# Patient Record
Sex: Female | Born: 1980 | Hispanic: No | Marital: Single | Smoking: Never smoker
Health system: Southern US, Community
[De-identification: ages and names within clinical notes are randomized; demographics above are authoritative.]

## PROBLEM LIST (undated history)

## (undated) DIAGNOSIS — F840 Autistic disorder: Secondary | ICD-10-CM

## (undated) HISTORY — DX: Autistic disorder: F84.0

---

## 2001-09-26 ENCOUNTER — Encounter: Payer: Self-pay | Admitting: Internal Medicine

## 2001-09-26 ENCOUNTER — Encounter: Admission: RE | Admit: 2001-09-26 | Discharge: 2001-09-26 | Payer: Self-pay | Admitting: Internal Medicine

## 2010-10-14 ENCOUNTER — Emergency Department (HOSPITAL_COMMUNITY): Admission: EM | Admit: 2010-10-14 | Discharge: 2010-10-14 | Payer: Self-pay | Admitting: Emergency Medicine

## 2012-07-18 ENCOUNTER — Emergency Department (HOSPITAL_COMMUNITY)
Admission: EM | Admit: 2012-07-18 | Discharge: 2012-07-18 | Disposition: A | Discharge status: Home / Self Care | Payer: No Typology Code available for payment source | Attending: Emergency Medicine | Admitting: Emergency Medicine

## 2012-07-18 ENCOUNTER — Encounter (HOSPITAL_COMMUNITY): Payer: Self-pay | Admitting: *Deleted

## 2012-07-18 DIAGNOSIS — Z043 Encounter for examination and observation following other accident: Secondary | ICD-10-CM | POA: Insufficient documentation
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
## 2012-07-19 NOTE — ED Provider Notes (Signed)
Medical screening examination/treatment/procedure(s) were performed by non-physician practitioner and as supervising physician I was immediately available for consultation/collaboration.  Christophr Calix, MD 07/19/12 0011 

## 2012-09-09 ENCOUNTER — Ambulatory Visit: Payer: Medicaid Other | Admitting: Physical Therapy

## 2012-09-22 ENCOUNTER — Ambulatory Visit: Payer: Medicaid Other | Attending: Orthopaedic Surgery | Admitting: Physical Therapy

## 2012-09-22 DIAGNOSIS — M25579 Pain in unspecified ankle and joints of unspecified foot: Secondary | ICD-10-CM | POA: Insufficient documentation

## 2012-09-22 DIAGNOSIS — IMO0001 Reserved for inherently not codable concepts without codable children: Secondary | ICD-10-CM | POA: Insufficient documentation

## 2012-09-22 DIAGNOSIS — R293 Abnormal posture: Secondary | ICD-10-CM | POA: Insufficient documentation

## 2012-09-30 ENCOUNTER — Ambulatory Visit: Payer: Medicaid Other | Admitting: Physical Therapy

## 2012-10-07 ENCOUNTER — Ambulatory Visit: Payer: Medicaid Other | Admitting: Rehabilitation

## 2012-10-09 ENCOUNTER — Ambulatory Visit: Payer: Medicaid Other | Admitting: Rehabilitation

## 2012-10-20 ENCOUNTER — Ambulatory Visit: Payer: No Typology Code available for payment source | Attending: Orthopaedic Surgery | Admitting: Rehabilitation

## 2012-10-20 DIAGNOSIS — R293 Abnormal posture: Secondary | ICD-10-CM | POA: Insufficient documentation

## 2012-10-20 DIAGNOSIS — M25579 Pain in unspecified ankle and joints of unspecified foot: Secondary | ICD-10-CM | POA: Insufficient documentation

## 2012-10-20 DIAGNOSIS — IMO0001 Reserved for inherently not codable concepts without codable children: Secondary | ICD-10-CM | POA: Insufficient documentation

## 2012-10-29 ENCOUNTER — Other Ambulatory Visit: Payer: Self-pay | Admitting: Internal Medicine

## 2012-10-29 DIAGNOSIS — R109 Unspecified abdominal pain: Secondary | ICD-10-CM

## 2012-11-03 ENCOUNTER — Ambulatory Visit
Admission: RE | Admit: 2012-11-03 | Discharge: 2012-11-03 | Disposition: A | Discharge status: Home / Self Care | Payer: Medicaid Other | Source: Ambulatory Visit | Attending: Internal Medicine | Admitting: Internal Medicine

## 2012-11-03 DIAGNOSIS — R109 Unspecified abdominal pain: Secondary | ICD-10-CM

## 2012-11-03 MED ORDER — IOHEXOL 300 MG/ML  SOLN
100.0000 mL | Freq: Once | INTRAMUSCULAR | Status: AC | PRN
  Administered 2012-11-03: 100 mL via INTRAVENOUS

## 2013-01-16 ENCOUNTER — Ambulatory Visit
Admission: RE | Admit: 2013-01-16 | Discharge: 2013-01-16 | Disposition: A | Discharge status: Home / Self Care | Payer: Medicaid Other | Source: Ambulatory Visit | Attending: Internal Medicine | Admitting: Internal Medicine

## 2013-01-16 ENCOUNTER — Other Ambulatory Visit: Payer: Self-pay | Admitting: Family

## 2013-01-16 DIAGNOSIS — R52 Pain, unspecified: Secondary | ICD-10-CM

## 2013-04-13 ENCOUNTER — Encounter: Payer: Self-pay | Admitting: Obstetrics & Gynecology

## 2013-04-20 ENCOUNTER — Other Ambulatory Visit: Payer: Self-pay | Admitting: *Deleted

## 2013-04-21 ENCOUNTER — Other Ambulatory Visit: Payer: Self-pay

## 2013-04-21 ENCOUNTER — Other Ambulatory Visit: Payer: Self-pay | Admitting: *Deleted

## 2013-04-21 DIAGNOSIS — D259 Leiomyoma of uterus, unspecified: Secondary | ICD-10-CM

## 2013-04-23 ENCOUNTER — Encounter: Payer: Self-pay | Admitting: Obstetrics & Gynecology

## 2013-04-27 ENCOUNTER — Ambulatory Visit: Payer: Self-pay | Admitting: Obstetrics & Gynecology

## 2013-05-06 ENCOUNTER — Encounter: Payer: Self-pay | Admitting: Obstetrics & Gynecology

## 2013-05-06 ENCOUNTER — Ambulatory Visit (INDEPENDENT_AMBULATORY_CARE_PROVIDER_SITE_OTHER): Payer: Medicaid Other | Admitting: Obstetrics & Gynecology

## 2013-05-06 VITALS — BP 104/74 | HR 102 | Temp 98.4°F | Ht 63.0 in | Wt 210.4 lb

## 2013-05-06 DIAGNOSIS — N83209 Unspecified ovarian cyst, unspecified side: Secondary | ICD-10-CM

## 2013-05-06 NOTE — Patient Instructions (Addendum)
Ovarian Cyst  The ovaries are small organs that are on each side of the uterus. The ovaries are the organs that produce the female hormones, estrogen and progesterone. An ovarian cyst is a sac filled with fluid that can vary in its size. It is normal for a small cyst to form in women who are in the childbearing age and who have menstrual periods. This type of cyst is called a follicle cyst that becomes an ovulation cyst (corpus luteum cyst) after it produces the women's egg. It later goes away on its own if the woman does not become pregnant. There are other kinds of ovarian cysts that may cause problems and may need to be treated. The most serious problem is a cyst with cancer. It should be noted that menopausal women who have an ovarian cyst are at a higher risk of it being a cancer cyst. They should be evaluated very quickly, thoroughly and followed closely. This is especially true in menopausal women because of the high rate of ovarian cancer in women in menopause.  CAUSES AND TYPES OF OVARIAN CYSTS:   FUNCTIONAL CYST: The follicle/corpus luteum cyst is a functional cyst that occurs every month during ovulation with the menstrual cycle. They go away with the next menstrual cycle if the woman does not get pregnant. Usually, there are no symptoms with a functional cyst.   ENDOMETRIOMA CYST: This cyst develops from the lining of the uterus tissue. This cyst gets in or on the ovary. It grows every month from the bleeding during the menstrual period. It is also called a "chocolate cyst" because it becomes filled with blood that turns brown. This cyst can cause pain in the lower abdomen during intercourse and with your menstrual period.   CYSTADENOMA CYST: This cyst develops from the cells on the outside of the ovary. They usually are not cancerous. They can get very big and cause lower abdomen pain and pain with intercourse. This type of cyst can twist on itself, cut off its blood supply and cause severe pain. It  also can easily rupture and cause a lot of pain.   DERMOID CYST: This type of cyst is sometimes found in both ovaries. They are found to have different kinds of body tissue in the cyst. The tissue includes skin, teeth, hair, and/or cartilage. They usually do not have symptoms unless they get very big. Dermoid cysts are rarely cancerous.   POLYCYSTIC OVARY: This is a rare condition with hormone problems that produces many small cysts on both ovaries. The cysts are follicle-like cysts that never produce an egg and become a corpus luteum. It can cause an increase in body weight, infertility, acne, increase in body and facial hair and lack of menstrual periods or rare menstrual periods. Many women with this problem develop type 2 diabetes. The exact cause of this problem is unknown. A polycystic ovary is rarely cancerous.   THECA LUTEIN CYST: Occurs when too much hormone (human chorionic gonadotropin) is produced and over-stimulates the ovaries to produce an egg. They are frequently seen when doctors stimulate the ovaries for invitro-fertilization (test tube babies).   LUTEOMA CYST: This cyst is seen during pregnancy. Rarely it can cause an obstruction to the birth canal during labor and delivery. They usually go away after delivery.  SYMPTOMS    Pelvic pain or pressure.   Pain during sexual intercourse.   Increasing girth (swelling) of the abdomen.   Abnormal menstrual periods.   Increasing pain with menstrual periods.     You stop having menstrual periods and you are not pregnant.  DIAGNOSIS   The diagnosis can be made during:   Routine or annual pelvic examination (common).   Ultrasound.   X-ray of the pelvis.   CT Scan.   MRI.   Blood tests.  TREATMENT    Treatment may only be to follow the cyst monthly for 2 to 3 months with your caregiver. Many go away on their own, especially functional cysts.   May be aspirated (drained) with a long needle with ultrasound, or by laparoscopy (inserting a tube into  the pelvis through a small incision).   The whole cyst can be removed by laparoscopy.   Sometimes the cyst may need to be removed through an incision in the lower abdomen.   Hormone treatment is sometimes used to help dissolve certain cysts.   Birth control pills are sometimes used to help dissolve certain cysts.  HOME CARE INSTRUCTIONS   Follow your caregiver's advice regarding:   Medicine.   Follow up visits to evaluate and treat the cyst.   You may need to come back or make an appointment with another caregiver, to find the exact cause of your cyst, if your caregiver is not a gynecologist.   Get your yearly and recommended pelvic examinations and Pap tests.   Let your caregiver know if you have had an ovarian cyst in the past.  SEEK MEDICAL CARE IF:    Your periods are late, irregular, they stop, or are painful.   Your stomach (abdomen) or pelvic pain does not go away.   Your stomach becomes larger or swollen.   You have pressure on your bladder or trouble emptying your bladder completely.   You have painful sexual intercourse.   You have feelings of fullness, pressure, or discomfort in your stomach.   You lose weight for no apparent reason.   You feel generally ill.   You become constipated.   You lose your appetite.   You develop acne.   You have an increase in body and facial hair.   You are gaining weight, without changing your exercise and eating habits.   You think you are pregnant.  SEEK IMMEDIATE MEDICAL CARE IF:    You have increasing abdominal pain.   You feel sick to your stomach (nausea) and/or vomit.   You develop a fever that comes on suddenly.   You develop abdominal pain during a bowel movement.   Your menstrual periods become heavier than usual.  Document Released: 11/05/2005 Document Revised: 01/28/2012 Document Reviewed: 09/08/2009  [employer] Patient Information 2014 [employer]

## 2013-05-06 NOTE — Progress Notes (Signed)
.   Subjective:     Charlotte White is a 32 y.o. female here for a routine exam.  Current complaints - She had a Lumbar-Spine MRI that shows a left ovarian hemorraghic cyst.  Personal health questionnaire reviewed: yes.   Gynecologic History Patient's last menstrual period was 04/21/2013. Contraception: none Last Pap: N/A  Last mammogram: N/A  Obstetric History OB History   Grav Para Term Preterm Abortions TAB SAB Ect Mult Living                   The following portions of the patient's history were reviewed and updated as appropriate: allergies, current medications, past family history, past medical history, past social history, past surgical history and problem list.  Review of Systems Pertinent items are noted in HPI.    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

## 2013-05-07 ENCOUNTER — Ambulatory Visit (HOSPITAL_COMMUNITY)
Admission: RE | Admit: 2013-05-07 | Discharge: 2013-05-07 | Disposition: A | Discharge status: Home / Self Care | Payer: Medicaid Other | Source: Ambulatory Visit | Attending: Obstetrics & Gynecology | Admitting: Obstetrics & Gynecology

## 2013-05-07 DIAGNOSIS — N946 Dysmenorrhea, unspecified: Secondary | ICD-10-CM | POA: Insufficient documentation

## 2013-05-07 DIAGNOSIS — D259 Leiomyoma of uterus, unspecified: Secondary | ICD-10-CM

## 2013-05-08 DIAGNOSIS — N83209 Unspecified ovarian cyst, unspecified side: Secondary | ICD-10-CM | POA: Insufficient documentation

## 2013-05-11 ENCOUNTER — Ambulatory Visit (INDEPENDENT_AMBULATORY_CARE_PROVIDER_SITE_OTHER): Payer: Medicaid Other | Admitting: Obstetrics & Gynecology

## 2013-05-11 ENCOUNTER — Encounter: Payer: Self-pay | Admitting: Obstetrics & Gynecology

## 2013-05-11 VITALS — BP 114/76 | HR 90 | Temp 98.3°F | Wt 209.0 lb

## 2013-05-11 DIAGNOSIS — Z0000 Encounter for general adult medical examination without abnormal findings: Secondary | ICD-10-CM

## 2013-05-11 NOTE — Progress Notes (Signed)
.   Subjective:     Charlotte White is a 32 y.o. female here for ultrasound results - done 05/07/13..  No current complaints.  Personal health questionnaire reviewed: no.   Gynecologic History Patient's last menstrual period was 04/21/2013. Contraception: none Last Pap: N/A Last mammogram: N/A  Obstetric History OB History   Grav Para Term Preterm Abortions TAB SAB Ect Mult Living                   The following portions of the patient's history were reviewed and updated as appropriate: allergies, current medications, past family history, past medical history, past social history, past surgical history and problem list.  Review of Systems Pertinent items are noted in HPI.    Objective:    No exam performed today, patient refused exam.    Assessment:    Healthy female exam.    Plan:    Education reviewed: none. Return prn

## 2013-05-11 NOTE — Patient Instructions (Addendum)

## 2013-05-13 LAB — PAP IG AND HPV HIGH-RISK: HPV DNA High Risk: NOT DETECTED

## 2013-05-27 ENCOUNTER — Ambulatory Visit: Payer: Medicaid Other | Admitting: Obstetrics & Gynecology

## 2013-07-27 ENCOUNTER — Ambulatory Visit: Payer: No Typology Code available for payment source

## 2013-07-27 ENCOUNTER — Ambulatory Visit: Payer: No Typology Code available for payment source | Admitting: Physical Therapy

## 2013-08-03 ENCOUNTER — Ambulatory Visit
Admission: RE | Admit: 2013-08-03 | Discharge: 2013-08-03 | Disposition: A | Discharge status: Home / Self Care | Payer: Medicaid Other | Source: Ambulatory Visit | Attending: Nurse Practitioner | Admitting: Nurse Practitioner

## 2013-08-03 ENCOUNTER — Other Ambulatory Visit: Payer: Self-pay | Admitting: Nurse Practitioner

## 2013-08-03 DIAGNOSIS — R109 Unspecified abdominal pain: Secondary | ICD-10-CM

## 2013-09-21 ENCOUNTER — Ambulatory Visit: Payer: Medicaid Other | Attending: Orthopedic Surgery | Admitting: Physical Therapy

## 2013-09-21 DIAGNOSIS — IMO0001 Reserved for inherently not codable concepts without codable children: Secondary | ICD-10-CM | POA: Insufficient documentation

## 2013-09-21 DIAGNOSIS — M545 Low back pain, unspecified: Secondary | ICD-10-CM | POA: Insufficient documentation

## 2013-09-21 DIAGNOSIS — M25559 Pain in unspecified hip: Secondary | ICD-10-CM | POA: Insufficient documentation

## 2013-09-21 DIAGNOSIS — R293 Abnormal posture: Secondary | ICD-10-CM | POA: Insufficient documentation

## 2013-09-28 ENCOUNTER — Ambulatory Visit: Payer: Medicaid Other | Admitting: Physical Therapy

## 2013-09-30 ENCOUNTER — Ambulatory Visit: Payer: Medicaid Other | Admitting: Physical Therapy

## 2013-10-05 ENCOUNTER — Encounter: Payer: Medicaid Other | Admitting: Physical Therapy

## 2013-10-07 ENCOUNTER — Ambulatory Visit: Payer: Medicaid Other | Admitting: Physical Therapy

## 2014-01-19 IMAGING — CR DG LUMBAR SPINE COMPLETE 4+V
5 series · 5 of 5 positions shown · non-contrast
Comparison: CT abdomen of 11/03/2012

CLINICAL DATA: Low back and bilateral leg pain, no injury

LUMBAR SPINE - COMPLETE 4+ VIEW

[t l-spine a.p.]
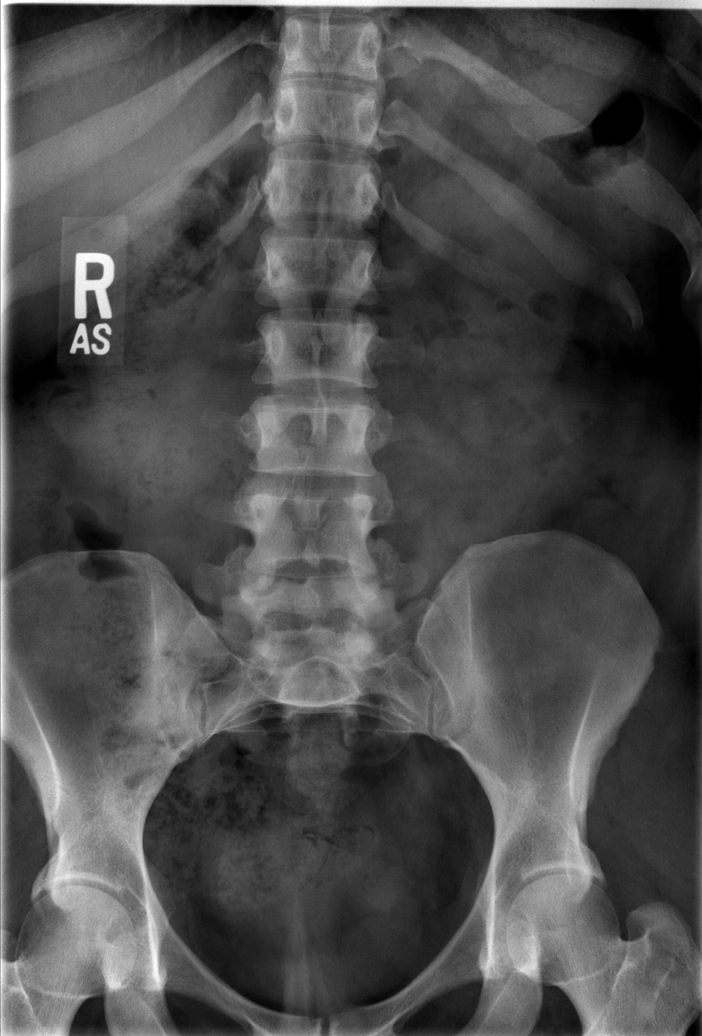

[t l-spine oblique exposure (1 of 2)]
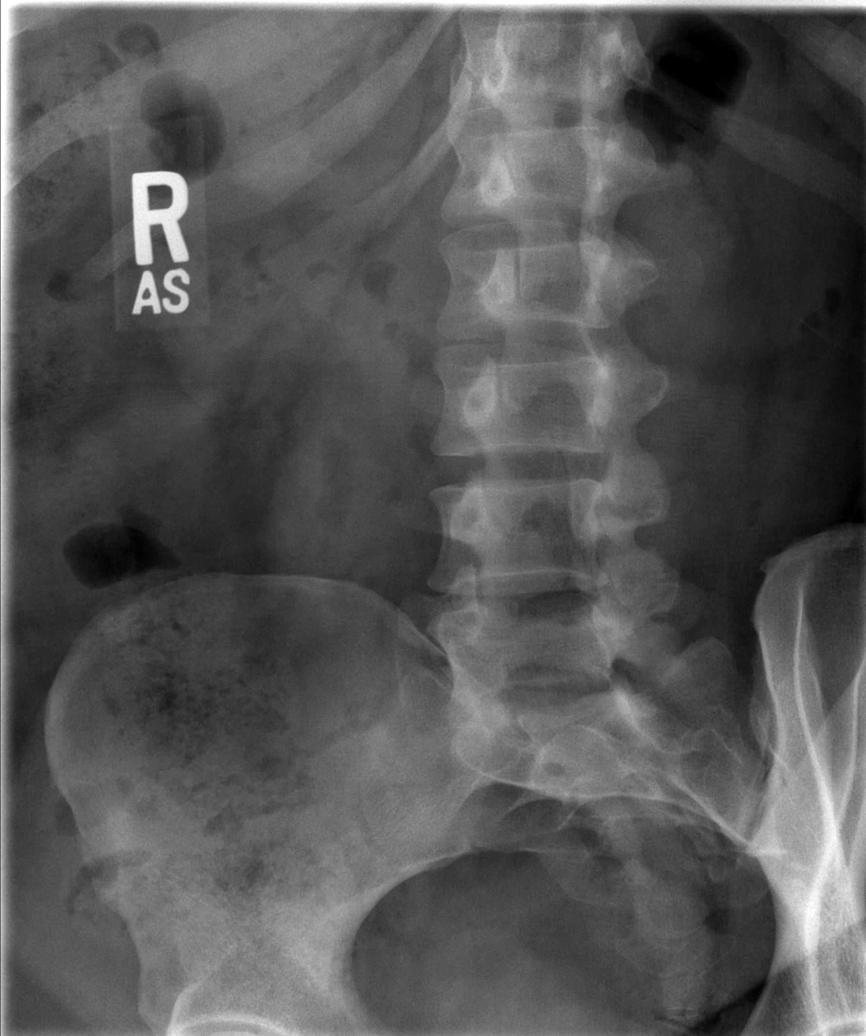

[t l-spine oblique exposure (2 of 2)]
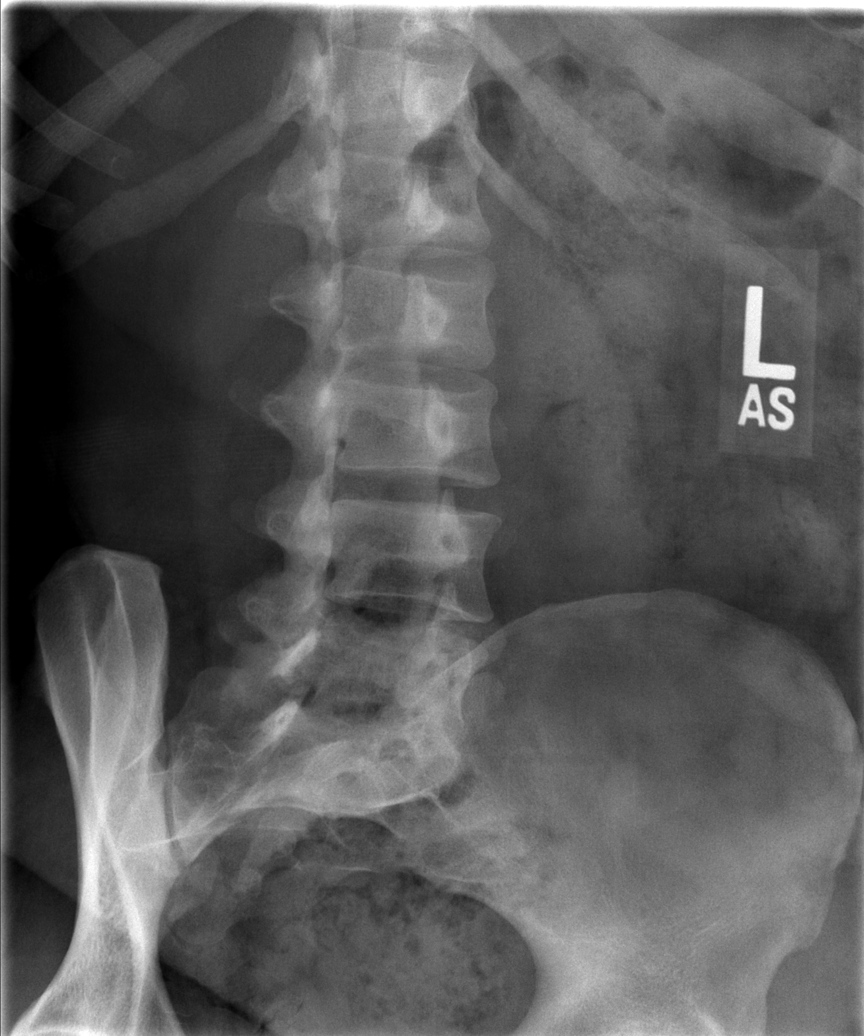

[t l-spine lat]
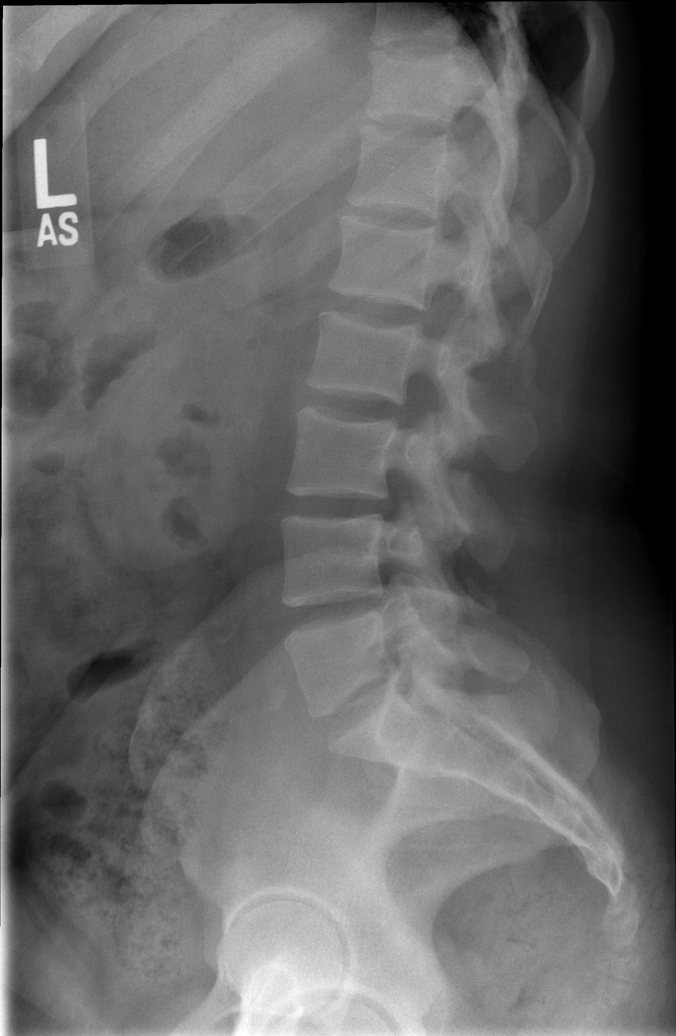

[t l-spine l5-s1 spot]
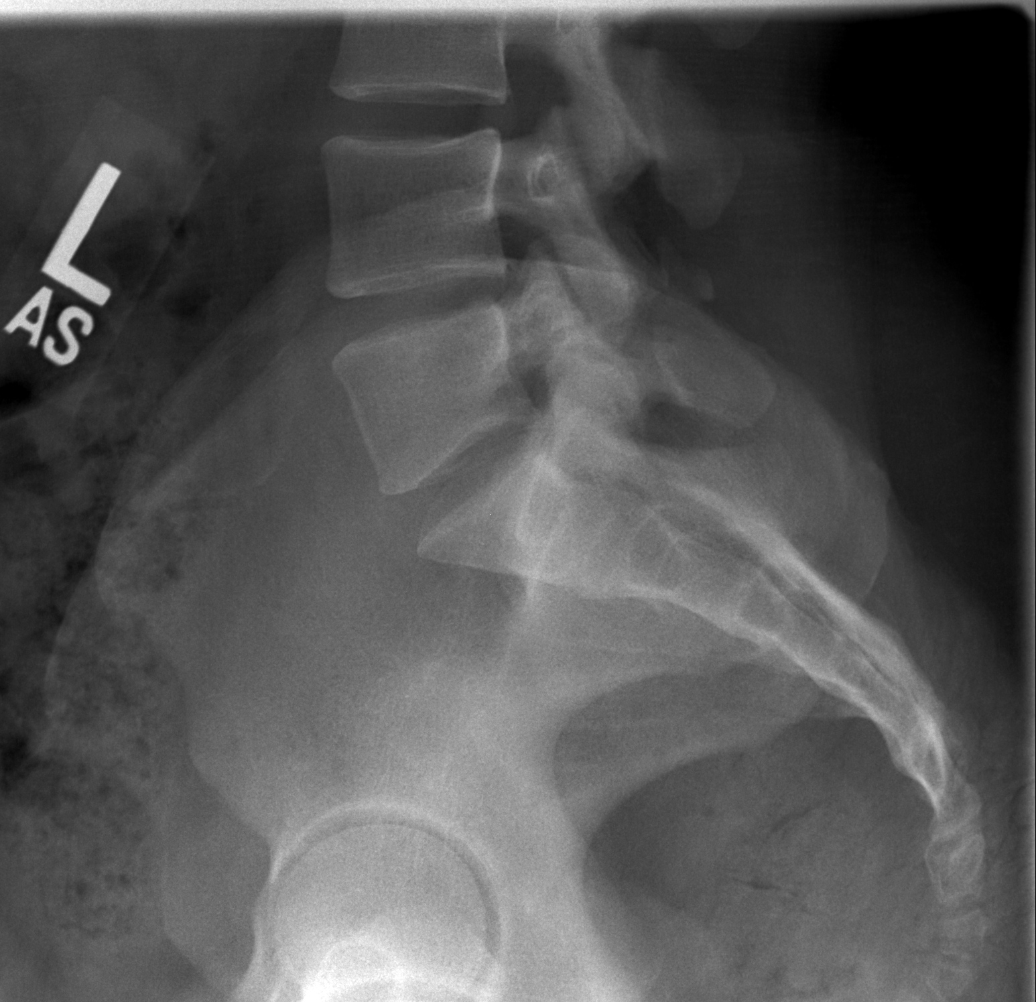

[5 of 5 positions shown; findings below may reference images not displayed]

FINDINGS: The lumbar vertebrae are in normal alignment.
Intervertebral disc spaces appear normal.  No compression deformity
is seen.  No pars defect is noted.  The SI joints appear
corticated.
IMPRESSION: Normal alignment.  Normal intervertebral disc spaces.

## 2015-08-17 DIAGNOSIS — M25559 Pain in unspecified hip: Secondary | ICD-10-CM | POA: Insufficient documentation

## 2017-05-17 ENCOUNTER — Other Ambulatory Visit: Payer: Self-pay | Admitting: Internal Medicine

## 2017-05-17 DIAGNOSIS — M79605 Pain in left leg: Principal | ICD-10-CM

## 2017-05-17 DIAGNOSIS — M79604 Pain in right leg: Secondary | ICD-10-CM

## 2017-05-20 ENCOUNTER — Ambulatory Visit
Admission: RE | Admit: 2017-05-20 | Discharge: 2017-05-20 | Disposition: A | Discharge status: Home / Self Care | Payer: Self-pay | Source: Ambulatory Visit | Attending: Internal Medicine | Admitting: Internal Medicine

## 2017-05-20 DIAGNOSIS — M79604 Pain in right leg: Secondary | ICD-10-CM

## 2017-05-20 DIAGNOSIS — M79605 Pain in left leg: Principal | ICD-10-CM

## 2017-07-25 DIAGNOSIS — E669 Obesity, unspecified: Secondary | ICD-10-CM | POA: Insufficient documentation

## 2017-08-06 DIAGNOSIS — D509 Iron deficiency anemia, unspecified: Secondary | ICD-10-CM | POA: Insufficient documentation

## 2017-08-22 ENCOUNTER — Ambulatory Visit (INDEPENDENT_AMBULATORY_CARE_PROVIDER_SITE_OTHER): Payer: Medicare HMO

## 2017-08-22 ENCOUNTER — Ambulatory Visit (INDEPENDENT_AMBULATORY_CARE_PROVIDER_SITE_OTHER): Payer: Medicare HMO | Admitting: Podiatry

## 2017-08-22 ENCOUNTER — Encounter: Payer: Self-pay | Admitting: Podiatry

## 2017-08-22 VITALS — BP 109/70 | HR 93 | Resp 16

## 2017-08-22 DIAGNOSIS — M722 Plantar fascial fibromatosis: Secondary | ICD-10-CM

## 2017-08-22 MED ORDER — TRIAMCINOLONE ACETONIDE 10 MG/ML IJ SUSP
10.0000 mg | Freq: Once | INTRAMUSCULAR | Status: AC
  Administered 2017-08-22: 10 mg

## 2017-08-22 NOTE — Progress Notes (Signed)
   Subjective:    Patient ID: Charlotte White, female    DOB: 09/28/1981, 36 y.o.   MRN: [id number]  HPI    Review of Systems  All other systems reviewed and are negative.      Objective:   Physical Exam        Assessment & Plan:

## 2017-08-22 NOTE — Progress Notes (Signed)
Subjective:    Patient ID: Charlotte White, female   DOB: 36 y.o.   MRN: [id number]   HPI patient presents with caregiver stating she's having a lot of pain in her right heel. States it's been present for on and off for 2 years worse over the last few months and patient states that she never has smoked    Review of Systems  All other systems reviewed and are negative.       Objective:  Physical Exam  Constitutional: She appears well-developed and well-nourished.  Cardiovascular: Intact distal pulses.   Pulmonary/Chest: Effort normal.  Musculoskeletal: Normal range of motion.  Neurological: She is alert.  Skin: Skin is warm.  Nursing note and vitals reviewed.  neurovascular status found to be intact with muscle strength adequate range of motion within normal limits with no equinus condition noted. Patient's noted to have exquisite discomfort in the right plantar fascia at the insertional point tendon into the calcaneus with inflammation and fluid around the medial band and moderate depression of the arch is noted     Assessment:    Acute plantar fasciitis right with inflammation fluid buildup     Plan:    H&P was found to be reviewed with patient and x-rays reviewed. I injected the plantar fascia right 3 mg Kenalog 5 mill grams Xylocaine and applied fascial brace instructed on supportive shoe physical therapy and patient be seen back to recheck again in the next several weeks  X-rays indicate small spur with no indication of stress fracture arthritis

## 2017-08-22 NOTE — Patient Instructions (Signed)

## 2017-08-26 ENCOUNTER — Ambulatory Visit: Payer: Self-pay | Admitting: Obstetrics

## 2017-08-30 ENCOUNTER — Ambulatory Visit (INDEPENDENT_AMBULATORY_CARE_PROVIDER_SITE_OTHER): Payer: Medicare HMO | Admitting: Podiatry

## 2017-08-30 DIAGNOSIS — M722 Plantar fascial fibromatosis: Secondary | ICD-10-CM

## 2017-08-30 MED ORDER — TRIAMCINOLONE ACETONIDE 10 MG/ML IJ SUSP
10.0000 mg | Freq: Once | INTRAMUSCULAR | Status: AC
  Administered 2017-08-30: 10 mg

## 2017-08-30 NOTE — Progress Notes (Signed)
Subjective:    Patient ID: Charlotte White, female   DOB: 36 y.o.   MRN: [id number]   HPI patient presents with caregiver stating that the heel is hurting again and it's worse when getting up in the morning and after periods of sitting    ROS      Objective:  Physical Exam neurovascular status intact with inflammatory capsulitis of the right plantar heel at the insertional point tendon calcaneus with depression of the arch noted     Assessment:    Continue plantar fasciitis right with inflammation fluid buildup     Plan:    Today I went ahead and I educated them on the importance of stretch and I did dispense a night splint to wear to try to support the plantar fascia at to take stress off her heel I reinjected the plantar fascial 3 Mill grams Kenalog 5 mill grams Xylocaine and patient be seen back to recheck

## 2017-09-04 ENCOUNTER — Other Ambulatory Visit (HOSPITAL_COMMUNITY)
Admission: RE | Admit: 2017-09-04 | Discharge: 2017-09-04 | Disposition: A | Discharge status: Home / Self Care | Payer: Medicare HMO | Source: Ambulatory Visit | Attending: Certified Nurse Midwife | Admitting: Certified Nurse Midwife

## 2017-09-04 ENCOUNTER — Ambulatory Visit (INDEPENDENT_AMBULATORY_CARE_PROVIDER_SITE_OTHER): Payer: Medicare HMO | Admitting: Certified Nurse Midwife

## 2017-09-04 ENCOUNTER — Encounter: Payer: Self-pay | Admitting: Obstetrics

## 2017-09-04 VITALS — BP 110/79 | HR 84 | Ht 64.0 in | Wt 218.3 lb

## 2017-09-04 DIAGNOSIS — N939 Abnormal uterine and vaginal bleeding, unspecified: Secondary | ICD-10-CM | POA: Insufficient documentation

## 2017-09-04 DIAGNOSIS — Z01419 Encounter for gynecological examination (general) (routine) without abnormal findings: Secondary | ICD-10-CM | POA: Insufficient documentation

## 2017-09-04 DIAGNOSIS — N898 Other specified noninflammatory disorders of vagina: Secondary | ICD-10-CM | POA: Insufficient documentation

## 2017-09-04 DIAGNOSIS — F840 Autistic disorder: Secondary | ICD-10-CM | POA: Insufficient documentation

## 2017-09-04 NOTE — Progress Notes (Signed)
Subjective:        Charlotte White is a 36 y.o. female here for a routine exam.  Current complaints: Patient here with her mother.  Patient is autistic.  Patient is able to understand simple commands.  This is her first GYN exam.  Does have a hx of abnormal periods lasting about 7 days with heavy bleeding, cramping and mood changes per her mother.  Mother desires for cycle control and has questions about fibroids and endometriosis.      Personal health questionnaire:  Is patient Charlotte White, have a family history of breast and/or ovarian cancer: no Is there a family history of uterine cancer diagnosed at age < 59, gastrointestinal cancer, urinary tract cancer, family member who is a Field seismologist syndrome-associated carrier: no Is the patient overweight and hypertensive, family history of diabetes, personal history of gestational diabetes, preeclampsia or PCOS: yes Is patient over 63, have PCOS,  family history of premature CHD under age 53, diabetes, smoke, have hypertension or peripheral artery disease:  no At any time, has a partner hit, kicked or otherwise hurt or frightened you?: no Over the past 2 weeks, have you felt down, depressed or hopeless?: no Over the past 2 weeks, have you felt little interest or pleasure in doing things?:no   Gynecologic History Patient's last menstrual period was 08/14/2017. Contraception: abstinence Last Pap: never.  Last mammogram: age <40. Per her mother does have a screening mammogram scheduled later this week.  No family history of BCA.    Obstetric History OB History  No data available    Past Medical History:  Diagnosis Date  . Autism     History reviewed. No pertinent surgical history.   Current Outpatient Prescriptions:  .  Biotin 10000 MCG TABS, Take by mouth., Disp: , Rfl:  .  Cholecalciferol (VITAMIN D3) 5000 UNIT/0.5ML LIQD, Vitamin D3  Take once daily, Disp: , Rfl:  .  ibuprofen (ADVIL,MOTRIN) 200 MG tablet, Take 200 mg by mouth  every 6 (six) hours as needed., Disp: , Rfl:  .  ranitidine (ZANTAC) 150 MG tablet, Take 150 mg by mouth 2 (two) times daily., Disp: , Rfl:  .  tiZANidine (ZANAFLEX) 4 MG tablet, tizanidine 4 mg tablet, Disp: , Rfl:  .  UNABLE TO FIND, Bio Freeze therapy cream, Disp: , Rfl:  .  UNABLE TO FIND, Magnesium Sport Balm, Disp: , Rfl:  .  Esomeprazole Magnesium (NEXIUM PO), Take 1 tablet by mouth daily., Disp: , Rfl:  .  Iron-Vitamin C (IRON 100/C) 100-250 MG TABS, Take 1 tablet by mouth daily. Folivane-Plus, Disp: , Rfl:  No Known Allergies  Social History  Substance Use Topics  . Smoking status: Never Smoker  . Smokeless tobacco: Never Used  . Alcohol use No    Family History  Problem Relation Age of Onset  . Heart disease Mother   . Hyperlipidemia Mother   . Hypertension Mother   . Heart disease Maternal Aunt   . Autism Maternal Aunt   . Heart disease Paternal Grandmother   . Ovarian cysts Sister       Review of Systems  Constitutional: negative for fatigue and weight loss Respiratory: negative for cough and wheezing Cardiovascular: negative for chest pain, fatigue and palpitations Gastrointestinal: negative for abdominal pain and change in bowel habits Musculoskeletal:negative for myalgias Neurological: negative for gait problems and tremors Behavioral/Psych: negative for abusive relationship, depression Endocrine: negative for temperature intolerance    Genitourinary:negative for abnormal menstrual periods, genital lesions, hot  flashes, sexual problems and vaginal discharge Integument/breast: negative for breast lump, breast tenderness, nipple discharge and skin lesion(s)    Objective:       BP 110/79   Pulse 84   Ht 5\' 4"  (1.626 m)   Wt 218 lb 4.8 oz (99 kg)   LMP 08/14/2017   BMI 37.47 kg/m  General:   alert  Skin:   no rash or abnormalities  Lungs:   clear to auscultation bilaterally  Heart:   regular rate and rhythm, S1, S2 normal, no murmur, click, rub or  gallop  Breasts:   normal without suspicious masses, skin or nipple changes or axillary nodes, dense breast tissue  Abdomen:  normal findings: no organomegaly, soft, non-tender and no hernia  Pelvis:  External genitalia: normal general appearance Urinary system: urethral meatus normal and bladder without fullness, nontender Vaginal: normal without tenderness, induration or masses, narrow interoitus Cervix: unable to visualize Adnexa: unable to tolerate Uterus: unable to tolerate   Lab Review Urine pregnancy test Labs reviewed no Radiologic studies reviewed no  50% of 45 min visit spent on counseling and coordination of care.    Assessment & Plan    Healthy female exam.    1. Well woman exam    - Cytology - PAP  2. Abnormal uterine bleeding (AUB)    - US Pelvis Complete; Future - Cervicovaginal ancillary only  3. Autism     4. Vaginal discharge    - Cervicovaginal ancillary only   Education reviewed: calcium supplements, depression evaluation, low fat, low cholesterol diet, safe sex/STD prevention, self breast exams, skin cancer screening and weight bearing exercise. Contraception: abstinence. Follow up in: 1 year.   Meds ordered this encounter  Medications  . Biotin 10000 MCG TABS    Sig: Take by mouth.  . ranitidine (ZANTAC) 150 MG tablet    Sig: Take 150 mg by mouth 2 (two) times daily.   Orders Placed This Encounter  Procedures  . US Pelvis Complete    Autistic, not able to tolerate transvaginal US.    Standing Status:   Future    Standing Expiration Date:   11/04/2018    Order Specific Question:   Reason for Exam (SYMPTOM  OR DIAGNOSIS REQUIRED)    Answer:   AUB    Order Specific Question:   Preferred imaging location?    Answer:   [facility]   Need to obtain previous records Possible management options include: Depo provera injections or LoLo Follow up as needed.

## 2017-09-04 NOTE — Progress Notes (Signed)
Patient is in the office with mother's assistance, states that patient has regular cycle every 21 days with heavy bleeding. Patient's cycle lasts about 7 days.

## 2017-09-05 LAB — CERVICOVAGINAL ANCILLARY ONLY
Bacterial vaginitis: POSITIVE — AB
Candida vaginitis: NEGATIVE

## 2017-09-06 ENCOUNTER — Telehealth: Payer: Self-pay

## 2017-09-06 ENCOUNTER — Other Ambulatory Visit: Payer: Self-pay | Admitting: Certified Nurse Midwife

## 2017-09-06 DIAGNOSIS — B9689 Other specified bacterial agents as the cause of diseases classified elsewhere: Secondary | ICD-10-CM

## 2017-09-06 DIAGNOSIS — N760 Acute vaginitis: Principal | ICD-10-CM

## 2017-09-06 LAB — CYTOLOGY - PAP
Diagnosis: NEGATIVE
HPV (WINDOPATH): NOT DETECTED

## 2017-09-06 MED ORDER — METRONIDAZOLE 500 MG PO TABS: 500.0000 mg | ORAL_TABLET | Freq: Two times a day (BID) | ORAL | 0 refills | Status: DC

## 2017-09-06 NOTE — Telephone Encounter (Signed)
Patient to pick up Rx at pharmacy.

## 2017-09-06 NOTE — Telephone Encounter (Signed)
-----   Message from Morene Crocker, CNM sent at 09/06/2017  9:48 AM EDT ----- Please call patient and notify of +BV results. Metrondiazole 500 mg PO BID x7 days. #14 no refills.  Please tell her not to drink alcohol with this medication as it will cause her to vomit.  Please also ask her if she will need anything for yeast.   Thank you, Kandis Cocking CNM

## 2017-09-11 ENCOUNTER — Ambulatory Visit (HOSPITAL_COMMUNITY): Payer: Medicare HMO

## 2017-09-12 ENCOUNTER — Other Ambulatory Visit: Payer: Self-pay | Admitting: Certified Nurse Midwife

## 2017-09-16 ENCOUNTER — Other Ambulatory Visit: Payer: Self-pay | Admitting: Certified Nurse Midwife

## 2017-09-18 ENCOUNTER — Ambulatory Visit (HOSPITAL_COMMUNITY): Payer: Medicare HMO

## 2017-09-27 ENCOUNTER — Ambulatory Visit (HOSPITAL_COMMUNITY): Payer: Medicare HMO

## 2017-10-21 ENCOUNTER — Ambulatory Visit (HOSPITAL_COMMUNITY)
Admission: RE | Admit: 2017-10-21 | Discharge: 2017-10-21 | Disposition: A | Discharge status: Home / Self Care | Payer: Medicare HMO | Source: Ambulatory Visit | Attending: Certified Nurse Midwife | Admitting: Certified Nurse Midwife

## 2017-10-21 DIAGNOSIS — N939 Abnormal uterine and vaginal bleeding, unspecified: Secondary | ICD-10-CM | POA: Diagnosis not present

## 2017-10-25 ENCOUNTER — Ambulatory Visit: Payer: Medicare HMO | Admitting: Certified Nurse Midwife

## 2018-03-28 ENCOUNTER — Telehealth: Payer: Self-pay

## 2018-03-28 NOTE — Telephone Encounter (Signed)
Pt mom called, to get follow up appt for pt. due to cyst on L ovary, was advised to follow up here.   Scheduler advised me that the pract. admin will handle since pt may require sedation for exams.

## 2018-03-31 ENCOUNTER — Ambulatory Visit (INDEPENDENT_AMBULATORY_CARE_PROVIDER_SITE_OTHER): Payer: Medicare HMO | Admitting: Obstetrics and Gynecology

## 2018-03-31 ENCOUNTER — Encounter: Payer: Self-pay | Admitting: Obstetrics and Gynecology

## 2018-03-31 VITALS — BP 122/80 | HR 77 | Ht 63.0 in | Wt 224.7 lb

## 2018-03-31 DIAGNOSIS — N83202 Unspecified ovarian cyst, left side: Secondary | ICD-10-CM
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
## 2018-03-31 NOTE — Progress Notes (Signed)
37 yo G0 here for follow up on left ovarian cyst. Patient reports onset of left groin pain earlier this month. The pain started after sitting on a bench that was unbalanced and caused her body to jerk. The pain is localized in her left upper thigh. She is able to ambulate and states that the pain is worst with prolonged sitting. She was prescribed Toradol which she plans on starting today. She denies any abdominal/pelvic pain. She reports a monthly cycle of 21 days with bleeding for 4-5 days. She is not sexually active.   Past Medical History:  Diagnosis Date  . Autism    No past surgical history on file. Family History  Problem Relation Age of Onset  . Heart disease Mother   . Hyperlipidemia Mother   . Hypertension Mother   . Heart disease Maternal Aunt   . Autism Maternal Aunt   . Heart disease Paternal Grandmother   . Ovarian cysts Sister    Social History   Tobacco Use  . Smoking status: Never Smoker  . Smokeless tobacco: Never Used  Substance Use Topics  . Alcohol use: No  . Drug use: No   ROS See pertinent in HPI  Blood pressure 122/80, pulse 77, height 5\' 3"  (1.6 m), weight 224 lb 11.2 oz (101.9 kg), last menstrual period 03/12/2018. GENERAL: Well-developed, well-nourished female in no acute distress.  ABDOMEN: Soft, nontender, nondistended. No organomegaly. PELVIC: Needs to be done under anesthesia NEURO: alert and oriented x 3  03/25/2018 MRI: left ovary with a 1.9 cm complex ovarian cyst, likely hemorrhagic in nature  A/P 37 yo G0 with left ovarian cyst and left upper thigh pain - Follow up pelvic ultrasound ordered to evaluate cyst. If persistent and increased in size in 6-8 weeks will consider surgical intervention - Informed patient that hemorrhagic cysts typically resolve without intervention - Given the location and nature of her pain, ovarian cyst is unlikely the cause of her upper thigh pain with flexion - Advised the use of toradol and follow up with PCP for  possible physical therapy evaluation - Patient will be contacted with results of her ultrasound - RTC prn

## 2018-05-06 ENCOUNTER — Ambulatory Visit (HOSPITAL_COMMUNITY): Payer: Medicare HMO

## 2018-07-15 ENCOUNTER — Ambulatory Visit (HOSPITAL_COMMUNITY): Payer: Medicare HMO

## 2018-08-01 ENCOUNTER — Ambulatory Visit (HOSPITAL_COMMUNITY): Admission: RE | Admit: 2018-08-01 | Payer: Medicare HMO | Source: Ambulatory Visit

## 2018-08-07 ENCOUNTER — Ambulatory Visit (HOSPITAL_COMMUNITY)
Admission: RE | Admit: 2018-08-07 | Discharge: 2018-08-07 | Disposition: A | Discharge status: Home / Self Care | Payer: Medicare HMO | Source: Ambulatory Visit | Attending: Obstetrics and Gynecology | Admitting: Obstetrics and Gynecology

## 2018-08-07 DIAGNOSIS — N83202 Unspecified ovarian cyst, left side: Secondary | ICD-10-CM | POA: Diagnosis present

## 2018-08-13 ENCOUNTER — Ambulatory Visit: Payer: Self-pay | Admitting: Obstetrics and Gynecology

## 2018-08-29 ENCOUNTER — Encounter: Payer: Self-pay | Admitting: Obstetrics and Gynecology

## 2018-08-29 ENCOUNTER — Ambulatory Visit: Payer: Medicare HMO | Admitting: Obstetrics and Gynecology

## 2018-08-29 DIAGNOSIS — N83202 Unspecified ovarian cyst, left side: Secondary | ICD-10-CM

## 2018-08-29 DIAGNOSIS — Z712 Person consulting for explanation of examination or test findings: Secondary | ICD-10-CM

## 2018-08-29 NOTE — Progress Notes (Signed)
37 yo returning today to discuss results of the ultrasound. Patient reports feeling well and denies any pelvic pain or dysmenorrhea. She is focused on losing weight. She is without complaints today  Past Medical History:  Diagnosis Date  . Autism    No past surgical history on file. Family History  Problem Relation Age of Onset  . Heart disease Mother   . Hyperlipidemia Mother   . Hypertension Mother   . Heart disease Maternal Aunt   . Autism Maternal Aunt   . Heart disease Paternal Grandmother   . Ovarian cysts Sister    Social History   Tobacco Use  . Smoking status: Never Smoker  . Smokeless tobacco: Never Used  Substance Use Topics  . Alcohol use: No  . Drug use: No   ROS See pertinent in HPI  GENERAL: Well-developed, well-nourished female in no acute distress.  NEURO: alert and oriented x 3 EXTREMITIES: No cyanosis, clubbing, or edema, 2+ distal pulses.  US Pelvis (transabdominal Only)  Result Date: 08/07/2018 CLINICAL DATA:  Follow-up complex cystic lesion of left ovary seen on recent MRI. LMP 07/25/2018. EXAM: TRANSABDOMINAL ULTRASOUND OF PELVIS TECHNIQUE: Transabdominal ultrasound examination of the pelvis was performed including evaluation of the uterus, ovaries, adnexal regions, and pelvic cul-de-sac. Transvaginal ultrasound was not ordered by ordering provider. COMPARISON:  Hip MRI on 03/25/2018 from [facility] orthopedic specialists FINDINGS: Uterus Measurements: 8.4 x 4.0 x 4.3 cm transabdominally. No fibroids or other mass visualized. Endometrium Thickness: 7 mm.  No focal abnormality visualized transabdominally. Right ovary Measurements: 2.9 x 1.7 x 1.5 cm. Normal appearance/no adnexal mass. Left ovary Measurements: 3.7 x 2.9 x 3.6 cm. Visualization is suboptimal on transabdominal sonography. A cystic lesion is seen which measures approximately 2.6 x 1.9 x 2.1 cm. At least 1 internal septation is seen, which appears similar to previous MRI. This is also unchanged  in size compared to previous MRI. Other findings:  No abnormal free fluid. IMPRESSION: 2.6 cm complex cystic lesion in left ovary is suboptimally visualized by transabdominal only ultrasound, but shows no significant change compared to previous MRI. This has indeterminate but probably benign characteristics, and differential diagnosis includes endometrioma and cystic ovarian neoplasm. Recommend correlation with tumor markers, and consider continued follow-up by trans-vaginal ultrasound or MRI in 6 months. This recommendation follows the consensus statement: Management of Asymptomatic Ovarian and Other Adnexal Cysts Imaged at [place]: Society of Radiologists in [place]. Radiology 2010; [phone]. Electronically Signed   By: Earle Gell M.D.   On: 08/07/2018 10:54    A/P 37 yo with complex left ovarian cyst unchanged in characteristic - Will continue to follow - plan for repeat ultrasound next year with transvaginal ultrasound  - encourage patient to continue her weight loss options

## 2020-11-03 ENCOUNTER — Other Ambulatory Visit: Payer: Self-pay

## 2020-11-03 ENCOUNTER — Emergency Department (HOSPITAL_COMMUNITY)
Admission: EM | Admit: 2020-11-03 | Discharge: 2020-11-03 | Disposition: A | Discharge status: Home / Self Care | Payer: Medicare HMO | Attending: Emergency Medicine | Admitting: Emergency Medicine

## 2020-11-03 ENCOUNTER — Encounter (HOSPITAL_COMMUNITY): Payer: Self-pay | Admitting: Obstetrics and Gynecology

## 2020-11-03 DIAGNOSIS — F840 Autistic disorder: Secondary | ICD-10-CM | POA: Insufficient documentation

## 2020-11-03 DIAGNOSIS — D649 Anemia, unspecified: Secondary | ICD-10-CM | POA: Insufficient documentation

## 2020-11-03 DIAGNOSIS — N939 Abnormal uterine and vaginal bleeding, unspecified: Secondary | ICD-10-CM | POA: Diagnosis not present

## 2020-11-03 DIAGNOSIS — N920 Excessive and frequent menstruation with regular cycle: Secondary | ICD-10-CM | POA: Insufficient documentation

## 2020-11-03 DIAGNOSIS — R799 Abnormal finding of blood chemistry, unspecified: Secondary | ICD-10-CM | POA: Diagnosis present

## 2020-11-03 LAB — BASIC METABOLIC PANEL
Anion gap: 10 (ref 5–15)
BUN: 9 mg/dL (ref 6–20)
CO2: 24 mmol/L (ref 22–32)
Calcium: 9.2 mg/dL (ref 8.9–10.3)
Chloride: 105 mmol/L (ref 98–111)
Creatinine, Ser: 0.62 mg/dL (ref 0.44–1.00)
GFR, Estimated: >60 mL/min (ref >60)
Glucose, Bld: 90 mg/dL (ref 70–99)
Potassium: 3.9 mmol/L (ref 3.5–5.1)
Sodium: 139 mmol/L (ref 135–145)

## 2020-11-03 LAB — CBC WITH DIFFERENTIAL/PLATELET
Abs Immature Granulocytes: 0.03 10*3/uL (ref 0.00–0.07)
Basophils Absolute: 0.1 10*3/uL (ref 0.0–0.1)
Basophils Relative: 1 %
Eosinophils Absolute: 0.1 10*3/uL (ref 0.0–0.5)
Eosinophils Relative: 1 %
HCT: 25.1 % — ABNORMAL LOW (ref 36.0–46.0)
Hemoglobin: 6.2 g/dL — CL (ref 12.0–15.0)
Immature Granulocytes: 0 %
Lymphocytes Relative: 34 %
Lymphs Abs: 2.9 10*3/uL (ref 0.7–4.0)
MCH: 14.3 pg — ABNORMAL LOW (ref 26.0–34.0)
MCHC: 24.7 g/dL — ABNORMAL LOW (ref 30.0–36.0)
MCV: 57.8 fL — ABNORMAL LOW (ref 80.0–100.0)
Monocytes Absolute: 0.7 10*3/uL (ref 0.1–1.0)
Monocytes Relative: 8 %
Neutro Abs: 4.7 10*3/uL (ref 1.7–7.7)
Neutrophils Relative %: 56 %
Platelets: 597 10*3/uL — ABNORMAL HIGH (ref 150–400)
RBC: 4.34 MIL/uL (ref 3.87–5.11)
RDW: 21 % — ABNORMAL HIGH (ref 11.5–15.5)
WBC: 8.5 10*3/uL (ref 4.0–10.5)
nRBC: 0 % (ref 0.0–0.2)

## 2020-11-03 LAB — ABO/RH: ABO/RH(D): O POS

## 2020-11-03 LAB — PREPARE RBC (CROSSMATCH)

## 2020-11-03 MED ORDER — LORAZEPAM 2 MG/ML IJ SOLN
0.5000 mg | Freq: Once | INTRAMUSCULAR | Status: AC
  Administered 2020-11-03: 15:00:00 0.5 mg via INTRAVENOUS
  Filled 2020-11-03: qty 1

## 2020-11-03 MED ORDER — SODIUM CHLORIDE 0.9% IV SOLUTION: Freq: Once | INTRAVENOUS | Status: DC

## 2020-11-03 MED ORDER — FERROUS SULFATE 325 (65 FE) MG PO TABS: 325.0000 mg | ORAL_TABLET | Freq: Every day | ORAL | 0 refills | Status: AC

## 2020-11-03 NOTE — ED Triage Notes (Signed)
Patient was called and told to come to the ER for blood transfusion as her Hgb was 6.3 on her labs that were drawn today

## 2020-11-03 NOTE — ED Notes (Signed)
RBC's infusing without difficulty. No evidence of reaction. Resting comfortably with mom at bedside.

## 2020-11-03 NOTE — ED Provider Notes (Signed)
[facility] DEPT Provider Note   CSN: [id number] Arrival date & time: 11/03/20  1315     History Chief Complaint  Patient presents with  . Abnormal Lab    Charlotte White is a 39 y.o. female.  Pt presents to the ED today with anemia.  Pt has autism and her mother gives the hx.  She was at a rheumatologist's office today because of various joint pains.  Blood work was done and hgb was 6.3.  Pt does have a hx of anemia and is supposed to be taking iron, but has not been taking the pills because they cause some nausea.  Pt's mom said she has heavy periods.  She has not had any dark stool, rectal bleeding or any other bleeding.  Mom said obgyn has recommended bcps or norplant, but she does not want them for patient.  Pt started her period today.        Past Medical History:  Diagnosis Date  . Autism     Patient Active Problem List   Diagnosis Date Noted  . Abnormal uterine bleeding (AUB) 09/04/2017  . Autism 09/04/2017    No past surgical history on file.   OB History   No obstetric history on file.     Family History  Problem Relation Age of Onset  . Heart disease Mother   . Hyperlipidemia Mother   . Hypertension Mother   . Heart disease Maternal Aunt   . Autism Maternal Aunt   . Heart disease Paternal Grandmother   . Ovarian cysts Sister     Social History   Tobacco Use  . Smoking status: Never Smoker  . Smokeless tobacco: Never Used  Substance Use Topics  . Alcohol use: No  . Drug use: No    Home Medications Prior to Admission medications   Medication Sig Start Date End Date Taking? Authorizing Provider  esomeprazole (NEXIUM) 40 MG capsule Take 40 mg by mouth daily.   Yes [provider]  ibuprofen (ADVIL,MOTRIN) 200 MG tablet Take 200 mg by mouth every 6 (six) hours as needed for mild pain.   Yes [provider]  traMADol (ULTRAM) 50 MG tablet Take 50 mg by mouth every 6 (six) hours as needed for  moderate pain.   Yes [provider]  ferrous sulfate 325 (65 FE) MG tablet Take 1 tablet (325 mg total) by mouth daily. 11/03/20   Isla Pence, MD  Influenza vac split quadrivalent PF (FLUZONE HIGH-DOSE) 0.5 ML injection Fluzone Quad 2018-19(PF) 60 mcg(15 mcgx4)/0.5 mL intramuscular syringe    [provider]    Allergies    Patient has no known allergies.  Review of Systems   Review of Systems  Neurological: Positive for weakness.  All other systems reviewed and are negative.   Physical Exam Updated Vital Signs BP 128/72   Pulse 79   Temp 98.3 F (36.8 C) (Oral)   Resp 14   LMP 11/19/2019   SpO2 100%   Physical Exam Vitals and nursing note reviewed.  Constitutional:      Appearance: Normal appearance. She is obese.  HENT:     Head: Normocephalic and atraumatic.     Right Ear: External ear normal.     Left Ear: External ear normal.     Nose: Nose normal.     Mouth/Throat:     Mouth: Mucous membranes are moist.     Pharynx: Oropharynx is clear.  Eyes:     Extraocular Movements:  Extraocular movements intact.     Conjunctiva/sclera: Conjunctivae normal.     Pupils: Pupils are equal, round, and reactive to light.  Cardiovascular:     Rate and Rhythm: Normal rate and regular rhythm.     Pulses: Normal pulses.     Heart sounds: Normal heart sounds.  Pulmonary:     Effort: Pulmonary effort is normal.     Breath sounds: Normal breath sounds.  Abdominal:     General: Abdomen is flat. Bowel sounds are normal.     Palpations: Abdomen is soft.  Musculoskeletal:        General: Normal range of motion.     Cervical back: Normal range of motion and neck supple.  Skin:    Capillary Refill: Capillary refill takes less than 2 seconds.     Coloration: Skin is pale.  Neurological:     General: No focal deficit present.     Mental Status: She is alert. Mental status is at baseline.  Psychiatric:        Mood and Affect: Mood normal.        Behavior:  Behavior normal.     ED Results / Procedures / Treatments   Labs (all labs ordered are listed, but only abnormal results are displayed) Labs Reviewed  CBC WITH DIFFERENTIAL/PLATELET - Abnormal; Notable for the following components:      Result Value   Hemoglobin 6.2 (*)    HCT 25.1 (*)    MCV 57.8 (*)    MCH 14.3 (*)    MCHC 24.7 (*)    RDW 21.0 (*)    Platelets 597 (*)    All other components within normal limits  BASIC METABOLIC PANEL  TYPE AND SCREEN  PREPARE RBC (CROSSMATCH)  ABO/RH    EKG None  Radiology No results found.  Procedures Procedures (including critical care time)  Medications Ordered in ED Medications  0.9 %  sodium chloride infusion (Manually program via Guardrails IV Fluids) (has no administration in time range)  LORazepam (ATIVAN) injection 0.5 mg (0.5 mg Intravenous Given 11/03/20 1514)    ED Course  I have reviewed the triage vital signs and the nursing notes.  Pertinent labs & imaging results that were available during my care of the patient were reviewed by me and considered in my medical decision making (see chart for details).    MDM Rules/Calculators/A&P                          Mom not interested in any hormones for patient.  She said pt has mood swings and the hormones will worsen them.  I told mom the hormones would help the bleeding and anemia.  Mom still not interested.  She said she wishes pt could get a hysterectomy.   Pt is given 1 unit of blood in ED.  Iron pills were refilled.  PT is to f/u with obgyn.  Return if worse.  CRITICAL CARE Performed by: Isla Pence   Total critical care time: 30 minutes  Critical care time was exclusive of separately billable procedures and treating other patients.  Critical care was necessary to treat or prevent imminent or life-threatening deterioration.  Critical care was time spent personally by me on the following activities: development of treatment plan with patient and/or  surrogate as well as nursing, discussions with consultants, evaluation of patient's response to treatment, examination of patient, obtaining history from patient or surrogate, ordering and performing treatments and interventions,  ordering and review of laboratory studies, ordering and review of radiographic studies, pulse oximetry and re-evaluation of patient's condition.  Final Clinical Impression(s) / ED Diagnoses Final diagnoses:  Symptomatic anemia  Menorrhagia with regular cycle    Rx / DC Orders ED Discharge Orders         Ordered    ferrous sulfate 325 (65 FE) MG tablet  Daily        11/03/20 1504           Isla Pence, MD 11/03/20 1607

## 2020-11-04 LAB — TYPE AND SCREEN
ABO/RH(D): O POS
Antibody Screen: NEGATIVE
Unit division: 0

## 2020-11-04 LAB — BPAM RBC
Blood Product Expiration Date: 202201182359
ISSUE DATE / TIME: 202112161625
Unit Type and Rh: 5100

## 2021-07-19 ENCOUNTER — Ambulatory Visit: Payer: Medicare HMO | Admitting: Podiatry

## 2021-07-25 ENCOUNTER — Ambulatory Visit (INDEPENDENT_AMBULATORY_CARE_PROVIDER_SITE_OTHER): Payer: Medicare HMO

## 2021-07-25 ENCOUNTER — Ambulatory Visit: Payer: Medicare HMO | Admitting: Podiatry

## 2021-07-25 ENCOUNTER — Other Ambulatory Visit: Payer: Self-pay

## 2021-07-25 ENCOUNTER — Encounter: Payer: Self-pay | Admitting: Podiatry

## 2021-07-25 DIAGNOSIS — S9030XA Contusion of unspecified foot, initial encounter: Secondary | ICD-10-CM

## 2021-07-25 DIAGNOSIS — N944 Primary dysmenorrhea: Secondary | ICD-10-CM | POA: Insufficient documentation

## 2021-07-25 DIAGNOSIS — J309 Allergic rhinitis, unspecified: Secondary | ICD-10-CM | POA: Insufficient documentation

## 2021-07-25 DIAGNOSIS — S9032XA Contusion of left foot, initial encounter: Secondary | ICD-10-CM | POA: Diagnosis not present

## 2021-07-25 DIAGNOSIS — E559 Vitamin D deficiency, unspecified: Secondary | ICD-10-CM | POA: Insufficient documentation

## 2021-07-25 DIAGNOSIS — R7309 Other abnormal glucose: Secondary | ICD-10-CM | POA: Insufficient documentation

## 2021-07-25 DIAGNOSIS — S90122A Contusion of left lesser toe(s) without damage to nail, initial encounter: Secondary | ICD-10-CM | POA: Diagnosis not present

## 2021-07-25 DIAGNOSIS — S90129A Contusion of unspecified lesser toe(s) without damage to nail, initial encounter: Secondary | ICD-10-CM | POA: Diagnosis not present

## 2021-07-27 NOTE — Progress Notes (Signed)
  Subjective:  Patient ID: Charlotte White, female    DOB: 11-14-1981,  MRN: [id number]  Chief Complaint  Patient presents with   Nail Problem    Urgent work in, bilateral great toenail injury, right worse than left    40 y.o. female presents with the above complaint. History confirmed with patient.  She is here with her mother.  She missed about 4 steps and fell down on last Monday.  She had bruising and swelling and is still painful.  Most the pain is in the left fourth and fifth toe of the right fifth toe  Objective:  Physical Exam: warm, good capillary refill, no trophic changes or ulcerative lesions, normal DP and PT pulses, and normal sensory exam. Some swelling over the left fourth and fifth toes with bruising and right fifth toe with bruising  No images are attached to the encounter.  Radiographs: Multiple views x-ray of both feet: no fracture, dislocation, swelling or degenerative changes noted Assessment:   1. Contusion of foot including toes, unspecified laterality, initial encounter      Plan:  Patient was evaluated and treated and all questions answered.  X-rays reviewed with patient and her mother discussed with her she has a contusion of both toes and expect this to resolve uneventfully.  Ice elevation and compression reviewed.  Tylenol as needed for pain.  Return if symptoms worsen or fail to improve.
